# Patient Record
Sex: Male | Born: 2000 | Race: White | Hispanic: No | Marital: Single | Smoking: Never smoker
Health system: Southern US, Community
[De-identification: ages and names within clinical notes are randomized; demographics above are authoritative.]

## PROBLEM LIST (undated history)

## (undated) DIAGNOSIS — R51 Headache: Secondary | ICD-10-CM

## (undated) DIAGNOSIS — R519 Headache, unspecified: Secondary | ICD-10-CM

## (undated) HISTORY — DX: Headache, unspecified: R51.9

## (undated) HISTORY — DX: Headache: R51

---
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
## 2019-01-07 DIAGNOSIS — M5134 Other intervertebral disc degeneration, thoracic region: Secondary | ICD-10-CM | POA: Diagnosis not present

## 2019-01-07 DIAGNOSIS — R200 Anesthesia of skin: Secondary | ICD-10-CM | POA: Diagnosis not present

## 2019-01-09 ENCOUNTER — Other Ambulatory Visit: Payer: Self-pay | Admitting: Neurology

## 2019-01-09 ENCOUNTER — Other Ambulatory Visit: Payer: Self-pay | Admitting: Internal Medicine

## 2019-01-09 ENCOUNTER — Other Ambulatory Visit: Payer: 59

## 2019-01-09 DIAGNOSIS — Z20822 Contact with and (suspected) exposure to covid-19: Secondary | ICD-10-CM

## 2019-01-09 DIAGNOSIS — R6889 Other general symptoms and signs: Secondary | ICD-10-CM | POA: Diagnosis not present

## 2019-01-09 DIAGNOSIS — R200 Anesthesia of skin: Secondary | ICD-10-CM

## 2019-01-11 ENCOUNTER — Other Ambulatory Visit: Payer: Self-pay

## 2019-01-11 ENCOUNTER — Ambulatory Visit (HOSPITAL_COMMUNITY)
Admission: RE | Admit: 2019-01-11 | Discharge: 2019-01-11 | Disposition: A | Discharge status: Home / Self Care | Payer: 59 | Source: Ambulatory Visit | Attending: Neurology | Admitting: Neurology

## 2019-01-11 DIAGNOSIS — R200 Anesthesia of skin: Secondary | ICD-10-CM | POA: Diagnosis not present

## 2019-01-11 MED ORDER — GADOBUTROL 1 MMOL/ML IV SOLN
7.0000 mL | Freq: Once | INTRAVENOUS | Status: AC | PRN
  Administered 2019-01-11: 7 mL via INTRAVENOUS

## 2019-01-13 LAB — NOVEL CORONAVIRUS, NAA: SARS-CoV-2, NAA: NOT DETECTED

## 2019-05-15 ENCOUNTER — Other Ambulatory Visit (INDEPENDENT_AMBULATORY_CARE_PROVIDER_SITE_OTHER): Payer: 59 | Admitting: *Deleted

## 2019-05-15 ENCOUNTER — Other Ambulatory Visit: Payer: Self-pay

## 2019-05-15 DIAGNOSIS — Z23 Encounter for immunization: Secondary | ICD-10-CM | POA: Diagnosis not present

## 2019-05-28 ENCOUNTER — Ambulatory Visit (INDEPENDENT_AMBULATORY_CARE_PROVIDER_SITE_OTHER): Payer: 59 | Admitting: Family Medicine

## 2019-05-28 ENCOUNTER — Other Ambulatory Visit: Payer: Self-pay

## 2019-05-28 ENCOUNTER — Encounter: Payer: Self-pay | Admitting: Family Medicine

## 2019-05-28 DIAGNOSIS — R05 Cough: Secondary | ICD-10-CM | POA: Diagnosis not present

## 2019-05-28 DIAGNOSIS — J301 Allergic rhinitis due to pollen: Secondary | ICD-10-CM | POA: Diagnosis not present

## 2019-05-28 DIAGNOSIS — R059 Cough, unspecified: Secondary | ICD-10-CM

## 2019-05-28 MED ORDER — FLUTICASONE PROPIONATE 50 MCG/ACT NA SUSP: 2.0000 | Freq: Every day | NASAL | 5 refills | Status: DC

## 2019-05-28 NOTE — Progress Notes (Addendum)
   Subjective:    Patient ID: Devin Palmer, male    DOB: December 26, 2000, 18 y.o.   MRN: [id number] Virtual Visit via Video Note  I connected with Devin Palmer on 06/16/19 at  4:10 PM EST by a video enabled telemedicine application and verified that I am speaking with the correct person using two identifiers.  Location: Patient: home Provider: office   I discussed the limitations of evaluation and management by telemedicine and the availability of in person appointments. The patient expressed understanding and agreed to proceed.  History of Present Illness:    Observations/Objective:   Assessment and Plan:   Follow Up Instructions:    I discussed the assessment and treatment plan with the patient. The patient was provided an opportunity to ask questions and all were answered. The patient agreed with the plan and demonstrated an understanding of the instructions.   The patient was advised to call back or seek an in-person evaluation if the symptoms worsen or if the condition fails to improve as anticipated.  I provided 17 minutes of non-face-to-face time during this encounter.   Devin Lange, MD   Cough This is a new problem. Episode onset: 2 weeks. The cough is productive of sputum (clear mucus). Associated symptoms include rhinorrhea. Pertinent negatives include no chest pain, chills, ear pain, fever or wheezing. Treatments tried: allergy meds. The treatment provided moderate relief.   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  HENT: Positive for congestion and rhinorrhea. Negative for ear pain.   Eyes: Negative for discharge.  Respiratory: Positive for cough. Negative for wheezing.   Cardiovascular: Negative for chest pain.  Gastrointestinal: Negative for nausea and vomiting.  Musculoskeletal: Negative for arthralgias.  Patient denies sinus tenderness     Objective:    Patient had virtual visit Appears to be in no distress Atraumatic Neuro able to relate and oriented No apparent resp distress Color normal      Assessment & Plan:  I believe the patient probably had a viral illness last week or environmental head congestion I believe he started to get better but the evening cough more likely postnasal drip I did offer to listen to his lungs at his convenience in the future In addition to this also recommend that the patient follow-up with [place] if any ongoing troubles Flonase and Claritin on a regular basis hopefully this will help lessen the patient to give [place] an update within the next 1 to 2 weeks

## 2019-06-14 ENCOUNTER — Ambulatory Visit (INDEPENDENT_AMBULATORY_CARE_PROVIDER_SITE_OTHER): Payer: 59 | Admitting: Family Medicine

## 2019-06-14 ENCOUNTER — Other Ambulatory Visit: Payer: Self-pay

## 2019-06-14 ENCOUNTER — Encounter: Payer: Self-pay | Admitting: Family Medicine

## 2019-06-14 DIAGNOSIS — R21 Rash and other nonspecific skin eruption: Secondary | ICD-10-CM

## 2019-06-14 MED ORDER — DOXYCYCLINE HYCLATE 100 MG PO TABS: 100.0000 mg | ORAL_TABLET | Freq: Two times a day (BID) | ORAL | 0 refills | Status: DC

## 2019-06-14 NOTE — Progress Notes (Signed)
   Subjective:  Audio plus video  Patient ID: Devin Palmer, male    DOB: Nov 20, 2000, 18 y.o.   MRN: [id number]  HPI  Patient stating that he popped a bump on his lip last night and this morning the bump is swollen and lip is swollen.  Virtual Visit via Video Note  I connected with Devin Palmer on 06/14/19 at 10:00 AM EST by a video enabled telemedicine application and verified that I am speaking with the correct person using two identifiers.  Location: Patient: home Provider: office   I discussed the limitations of evaluation and management by telemedicine and the availability of in person appointments. The patient expressed understanding and agreed to proceed.  History of Present Illness:    Observations/Objective:   Assessment and Plan:   Follow Up Instructions:    I discussed the assessment and treatment plan with the patient. The patient was provided an opportunity to ask questions and all were answered. The patient agreed with the plan and demonstrated an understanding of the instructions.   The patient was advised to call back or seek an in-person evaluation if the symptoms worsen or if the condition fails to improve as anticipated.  I provided 18 minutes of non-face-to-face time during this encounter.   Patient had a pimple-like bump on his lower lip.  No history of fever blisters.  He messed with it and squeeze on it.  Today he has developed substantial more swelling of his lower lip.  Somewhat tender to palpation.  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  Warning signs discussed.  Antibiotics prescribed Doxy 100 twice daily 10 days local measures discussed

## 2019-11-06 ENCOUNTER — Telehealth: Payer: Self-pay | Admitting: Family Medicine

## 2019-11-06 NOTE — Telephone Encounter (Signed)
Contacted mom. Mom verbalized understanding. Mom states she will get Dylan to call back and schedule appt.

## 2019-11-06 NOTE — Telephone Encounter (Signed)
Needs ov for documentation purposes

## 2019-11-06 NOTE — Telephone Encounter (Signed)
Pt mom sent MyChart message:   Hey Dr. Nicki Reaper. Camillia Herter has been having light sensitivity.  Sometimes causes him headaches but not always Dominica Severin has this bad too). Was wandering if we could drop off a form from [facility] for tinted windows?   We already had a letter from Dr. Marin Comment (our neighbor) but would rather have one from you.  Or if he has to see you.   Thanks.  Please advise. Thank you

## 2019-11-18 ENCOUNTER — Other Ambulatory Visit: Payer: Self-pay

## 2019-11-18 ENCOUNTER — Telehealth: Payer: Self-pay | Admitting: *Deleted

## 2019-11-18 ENCOUNTER — Telehealth (INDEPENDENT_AMBULATORY_CARE_PROVIDER_SITE_OTHER): Payer: 59 | Admitting: Family Medicine

## 2019-11-18 DIAGNOSIS — R519 Headache, unspecified: Secondary | ICD-10-CM

## 2019-11-18 DIAGNOSIS — L568 Other specified acute skin changes due to ultraviolet radiation: Secondary | ICD-10-CM

## 2019-11-18 NOTE — Progress Notes (Signed)
   Subjective:    Patient ID: Devin Palmer, male    DOB: 09-Aug-2000, 19 y.o.   MRN: [id number]  Headache  This is a recurrent problem. Episode onset: summertime when its bright outside. Pain location: forehead and behind eyes. Quality: pressure. Treatments tried: sunglasses when outside.  Patient gets headaches when exposed to bright lights and sunlight he has to use dark sunglasses to lessen the problem when he drives some coming in through the side causes frequent headaches for him Virtual Visit via Video Note  I connected with Devin Palmer on 11/18/19 at  2:00 PM EDT by a video enabled telemedicine application and verified that I am speaking with the correct person using two identifiers.  Location: Patient: home Provider: [place]   I discussed the limitations of evaluation and management by telemedicine and the availability of in person appointments. The patient expressed understanding and agreed to proceed.  History of Present Illness:    Observations/Objective:   Assessment and Plan:   Follow Up Instructions:    I discussed the assessment and treatment plan with the patient. The patient was provided an opportunity to ask questions and all were answered. The patient agreed with the plan and demonstrated an understanding of the instructions.   The patient was advised to call back or seek an in-person evaluation if the symptoms worsen or if the condition fails to improve as anticipated.  I provided 20- minutes of non-face-to-face time during this encounter.      Review of Systems  Neurological: Positive for headaches.  Denies double vision nausea vomiting     Objective:   Physical Exam  Patient had virtual visit Appears to be in no distress Atraumatic Neuro able to relate and oriented No apparent resp distress Color normal       Assessment & Plan:  Frequent headaches related to bright light May use dark sunglasses Waiver for him to get tinted windows  was signed Patient was cautioned that dark windows could increase the risk of accidents and that is very important for him to always be certain no cars are coming in his area

## 2019-11-18 NOTE — Telephone Encounter (Signed)
Devin Palmer, Devin Palmer are scheduled for a virtual visit with your provider today.    Just as we do with appointments in the office, we must obtain your consent to participate.  Your consent will be active for this visit and any virtual visit you may have with one of our providers in the next 365 days.    If you have a MyChart account, I can also send a copy of this consent to you electronically.  All virtual visits are billed to your insurance company just like a traditional visit in the office.  As this is a virtual visit, video technology does not allow for your provider to perform a traditional examination.  This may limit your provider's ability to fully assess your condition.  If your provider identifies any concerns that need to be evaluated in person or the need to arrange testing such as labs, EKG, etc, we will make arrangements to do so.    Although advances in technology are sophisticated, we cannot ensure that it will always work on either your end or our end.  If the connection with a video visit is poor, we may have to switch to a telephone visit.  With either a video or telephone visit, we are not always able to ensure that we have a secure connection.   I need to obtain your verbal consent now.   Are you willing to proceed with your visit today?   Devin Palmer has provided verbal consent on 11/18/2019 for a virtual visit (video or telephone).   Dayton Bailiff, LPN [id number]  [id number] PM

## 2019-12-06 DIAGNOSIS — Z20822 Contact with and (suspected) exposure to covid-19: Secondary | ICD-10-CM | POA: Diagnosis not present

## 2019-12-06 DIAGNOSIS — Z03818 Encounter for observation for suspected exposure to other biological agents ruled out: Secondary | ICD-10-CM | POA: Diagnosis not present

## 2019-12-06 DIAGNOSIS — Z20828 Contact with and (suspected) exposure to other viral communicable diseases: Secondary | ICD-10-CM | POA: Diagnosis not present

## 2020-04-23 ENCOUNTER — Other Ambulatory Visit: Payer: Self-pay

## 2020-04-23 ENCOUNTER — Ambulatory Visit (INDEPENDENT_AMBULATORY_CARE_PROVIDER_SITE_OTHER): Payer: 59 | Admitting: Family Medicine

## 2020-04-23 ENCOUNTER — Encounter: Payer: Self-pay | Admitting: Family Medicine

## 2020-04-23 VITALS — HR 72 | Temp 99.8°F | Resp 16

## 2020-04-23 DIAGNOSIS — R509 Fever, unspecified: Secondary | ICD-10-CM

## 2020-04-23 DIAGNOSIS — B349 Viral infection, unspecified: Secondary | ICD-10-CM | POA: Diagnosis not present

## 2020-04-23 LAB — POCT RAPID STREP A (OFFICE): Rapid Strep A Screen: NEGATIVE

## 2020-04-23 NOTE — Progress Notes (Signed)
   Patient ID: Devin Palmer, male    DOB: [birth year], 19 y.o.   MRN: [id number]   Chief Complaint  Patient presents with  . Sore Throat   Subjective:  CC: low-grade fever, scratchy throat, and vomited x 1  Sore Throat  This is a new problem. Episode onset: tuesday. Maximum temperature: low grade. Associated symptoms include congestion and vomiting. Pertinent negatives include no headaches. He has tried acetaminophen for the symptoms.  Patient states he has not had any symptoms since Tuesday other than some congestion and low grade fever. Denies headache and no stomach pain now.    Medical History Devin Palmer has a past medical history of Generalized headaches.   No outpatient encounter medications on file as of 04/23/2020.   No facility-administered encounter medications on file as of 04/23/2020.     Review of Systems  Constitutional: Positive for fever.  HENT: Positive for congestion and sore throat.        Described as scratchy. Okay now.  Gastrointestinal: Positive for vomiting.  Neurological: Negative for headaches.     Vitals Pulse 72   Temp 99.8 F (37.7 C)   Resp 16   SpO2 96%   Objective:   Physical Exam Vitals and nursing note reviewed.  Constitutional:      Appearance: He is not ill-appearing or toxic-appearing.  HENT:     Right Ear: Tympanic membrane normal.     Left Ear: Tympanic membrane normal.     Mouth/Throat:     Pharynx: Posterior oropharyngeal erythema present. No oropharyngeal exudate or uvula swelling.     Tonsils: No tonsillar exudate.  Cardiovascular:     Rate and Rhythm: Normal rate and regular rhythm.     Heart sounds: Normal heart sounds.  Pulmonary:     Effort: Pulmonary effort is normal.     Breath sounds: Normal breath sounds.  Lymphadenopathy:     Cervical:     Right cervical: No superficial, deep or posterior cervical adenopathy.    Left cervical: No superficial, deep or posterior cervical adenopathy.  Skin:    General: Skin  is warm and dry.  Neurological:     Mental Status: He is alert and oriented to person, place, and time.      Assessment and Plan   1. Fever, unspecified fever cause - Novel Coronavirus, NAA ([facility]) - Culture, Group A Strep - POCT rapid strep A  2. Viral syndrome   Reports not feeling too bad. Has cough and low grade fever. T-max 100.3 at home. Has history of asymptomatic strep infection: rapid strep negative today, will culture. No adenopathy or pharyngeal exudate today.  Covid test performed: will notify with results.   This is likely a virus and will run its course. Will notify you when Covid results are available. Will treat symptoms with OTC medications as needed and lots of rest and hydration. Tylenol has been effective for fever.   Agrees with plan of care discussed today. Understands warning signs to seek further care: fever, shortness of breath.  Understands to follow-up if he is not improving or his symptoms worsen.

## 2020-04-25 LAB — SPECIMEN STATUS REPORT

## 2020-04-25 LAB — SARS-COV-2, NAA 2 DAY TAT

## 2020-04-25 LAB — NOVEL CORONAVIRUS, NAA: SARS-CoV-2, NAA: NOT DETECTED

## 2020-04-27 LAB — SPECIMEN STATUS REPORT

## 2020-04-27 LAB — CULTURE, GROUP A STREP

## 2020-04-29 ENCOUNTER — Other Ambulatory Visit: Payer: Self-pay | Admitting: Family Medicine

## 2020-04-29 DIAGNOSIS — B955 Unspecified streptococcus as the cause of diseases classified elsewhere: Secondary | ICD-10-CM | POA: Insufficient documentation

## 2020-04-29 MED ORDER — PENICILLIN V POTASSIUM 500 MG PO TABS: 500.0000 mg | ORAL_TABLET | Freq: Three times a day (TID) | ORAL | 0 refills | Status: AC

## 2020-08-13 ENCOUNTER — Telehealth (INDEPENDENT_AMBULATORY_CARE_PROVIDER_SITE_OTHER): Payer: Self-pay | Admitting: Internal Medicine

## 2020-08-13 NOTE — Telephone Encounter (Signed)
Spoke with mother Devin Melroy RN at [facility] who has an appointment on Tuesday 2/1 - she would like for patient to take her appointment on Tuesday - he has been having loose stools and acid reflux - please advise if you need a referral or not

## 2020-08-14 ENCOUNTER — Ambulatory Visit: Payer: 59 | Admitting: Family Medicine

## 2020-08-14 NOTE — Telephone Encounter (Signed)
Yes we can see Devin Palmer instead of Christy on Tuesday

## 2020-08-19 ENCOUNTER — Encounter: Payer: Self-pay | Admitting: Family Medicine

## 2020-08-19 ENCOUNTER — Ambulatory Visit (INDEPENDENT_AMBULATORY_CARE_PROVIDER_SITE_OTHER): Payer: 59 | Admitting: Family Medicine

## 2020-08-19 ENCOUNTER — Other Ambulatory Visit: Payer: Self-pay

## 2020-08-19 VITALS — BP 112/72 | HR 109 | Temp 98.9°F | Ht 74.0 in | Wt 182.4 lb

## 2020-08-19 DIAGNOSIS — R197 Diarrhea, unspecified: Secondary | ICD-10-CM

## 2020-08-19 DIAGNOSIS — K219 Gastro-esophageal reflux disease without esophagitis: Secondary | ICD-10-CM | POA: Insufficient documentation

## 2020-08-19 DIAGNOSIS — Z91013 Allergy to seafood: Secondary | ICD-10-CM | POA: Diagnosis not present

## 2020-08-19 MED ORDER — VALACYCLOVIR HCL 1 G PO TABS: ORAL_TABLET | ORAL | 5 refills | Status: DC

## 2020-08-19 NOTE — Progress Notes (Signed)
Referral placed.

## 2020-08-19 NOTE — Addendum Note (Signed)
Addended by: Vicente Males on: 08/19/2020 04:22 PM   Modules accepted: Orders

## 2020-08-19 NOTE — Patient Instructions (Addendum)
Food Choices for Gastroesophageal Reflux Disease, Adult When you have gastroesophageal reflux disease (GERD), the foods you eat and your eating habits are very important. Choosing the right foods can help ease your discomfort. Think about working with a food expert (dietitian) to help you make good choices. What are tips for following this plan? Reading food labels  Look for foods that are low in saturated fat. Foods that may help with your symptoms include: ? Foods that have less than 5% of daily value (DV) of fat. ? Foods that have 0 grams of trans fat. Cooking  Do not fry your food.  Cook your food by baking, steaming, grilling, or broiling. These are all methods that do not need a lot of fat for cooking.  To add flavor, try to use herbs that are low in spice and acidity. Meal planning  Choose healthy foods that are low in fat, such as: ? Fruits and vegetables. ? Whole grains. ? Low-fat dairy products. ? Lean meats, fish, and poultry.  Eat small meals often instead of eating 3 large meals each day. Eat your meals slowly in a place where you are relaxed. Avoid bending over or lying down until 2-3 hours after eating.  Limit high-fat foods such as fatty meats or fried foods.  Limit your intake of fatty foods, such as oils, butter, and shortening.  Avoid the following as told by your doctor: ? Foods that cause symptoms. These may be different for different people. Keep a food diary to keep track of foods that cause symptoms. ? Alcohol. ? Drinking a lot of liquid with meals. ? Eating meals during the 2-3 hours before bed.   Lifestyle  Stay at a healthy weight. Ask your doctor what weight is healthy for you. If you need to lose weight, work with your doctor to do so safely.  Exercise for at least 30 minutes on 5 or more days each week, or as told by your doctor.  Wear loose-fitting clothes.  Do not smoke or use any products that contain nicotine or tobacco. If you need help  quitting, ask your doctor.  Sleep with the head of your bed higher than your feet. Use a wedge under the mattress or blocks under the bed frame to raise the head of the bed.  Chew sugar-free gum after meals. What foods should eat? Eat a healthy, well-balanced diet of fruits, vegetables, whole grains, low-fat dairy products, lean meats, fish, and poultry. Each person is different. Foods that may cause symptoms in one person may not cause any symptoms in another person. Work with your doctor to find foods that are safe for you. The items listed above may not be a complete list of what you can eat and drink. Contact a food expert for more options.   What foods should I avoid? Limiting some of these foods may help in managing the symptoms of GERD. Everyone is different. Talk with a food expert or your doctor to help you find the exact foods to avoid, if any. Fruits Any fruits prepared with added fat. Any fruits that cause symptoms. For some people, this may include citrus fruits, such as oranges, grapefruit, pineapple, and lemons. Vegetables Deep-fried vegetables. French fries. Any vegetables prepared with added fat. Any vegetables that cause symptoms. For some people, this may include tomatoes and tomato products, chili peppers, onions and garlic, and horseradish. Grains Pastries or quick breads with added fat. Meats and other proteins High-fat meats, such as fatty beef or pork,   hot dogs, ribs, ham, sausage, salami, and bacon. Fried meat or protein, including fried fish and fried chicken. Nuts and nut butters, in large amounts. Dairy Whole milk and chocolate milk. Sour cream. Cream. Ice cream. Cream cheese. Milkshakes. Fats and oils Butter. Margarine. Shortening. Ghee. Beverages Coffee and tea, with or without caffeine. Carbonated beverages. Sodas. Energy drinks. Fruit juice made with acidic fruits, such as orange or grapefruit. Tomato juice. Alcoholic drinks. Sweets and desserts Chocolate and  cocoa. Donuts. Seasonings and condiments Pepper. Peppermint and spearmint. Added salt. Any condiments, herbs, or seasonings that cause symptoms. For some people, this may include curry, hot sauce, or vinegar-based salad dressings. The items listed above may not be a complete list of what you should not eat and drink. Contact a food expert for more options. Questions to ask your doctor Diet and lifestyle changes are often the first steps that are taken to manage symptoms of GERD. If diet and lifestyle changes do not help, talk with your doctor about taking medicines. Where to find more information  International Foundation for Gastrointestinal Disorders: aboutgerd.org Summary  When you have GERD, food and lifestyle choices are very important in easing your symptoms.  Eat small meals often instead of 3 large meals a day. Eat your meals slowly and in a place where you are relaxed.  Avoid bending over or lying down until 2-3 hours after eating.  Limit high-fat foods such as fatty meats or fried foods. This information is not intended to replace advice given to you by your health care provider. Make sure you discuss any questions you have with your health care provider. Document Revised: 01/13/2020 Document Reviewed: 01/13/2020 Elsevier Patient Education  2021 Elsevier Inc.  

## 2020-08-19 NOTE — Progress Notes (Signed)
   Subjective:    Patient ID: Devin Palmer, male    DOB: 2001-02-03, 20 y.o.   MRN: [id number]  HPI Pt has been having abdominal pain for a few months. Pt has spoke with provider about a possible shellfish allergy. Pt states his pain has recently improved but before improving, the pain worsened. Pt states the pain is worse when needing to use restroom. Pt states that when he eats shellfish, he has an immediate reaction and begins to have abdominal cramps and diarrhea. Shellfish does not bother him if it is cooked with something.    occas reflux feels like he gets this in the back of his throat occurs twice a week we have discussed the importance of a healthy diet minimizing tomato based products chocolates caffeine's  He denies heartburn with it denies dysphagia denies any vomiting has tried OTC Tums  He also relates that he has an area that has appeared on the right lower lip that has intermittently occurred where it seemed like a pimple and then it swells up really big and a lot of times gets better with Benadryl or just gets better on its own over 2 to 3 days no pus from it no drainage not severely tender   Review of Systems    Please see above Objective:   Physical Exam Neck no masses no adenopathy lungs clear no crackles heart was regular abdomen is soft no masses no tenderness HEENT he has a couple what looks like temple areas near the right outer corner of his mouth  He shows me a video that depicts a swollen lip around the area of this when it does happen it seems to happen every several months       Assessment & Plan:  It is possible this could be herpes simplex recommend Valtrex 1000 mg take 2 now then repeat in 12 hours when these occur  I doubt that the lip issue is allergy I doubt it is a bacterial infection because it gets better so quickly quickly  Possible shellfish allergy causing the abdominal cramps and diarrhea offered blood testing as well as allergist testing  patient defers he states he will stay away from shellfish.  Less likely to be IBS because it only occurs with shellfish  Mild reflux related issues recommend trying Maalox or Mylanta and doing a reflux related diet because of the infrequency of his symptoms I do not recommend a PPI at this point  Later in the visit patient relates that his mother texted him that she would like for him to be seen by Dr.Rehmans group we will be happy to help set up the referral

## 2020-09-01 ENCOUNTER — Encounter (INDEPENDENT_AMBULATORY_CARE_PROVIDER_SITE_OTHER): Payer: Self-pay | Admitting: Internal Medicine

## 2020-09-01 ENCOUNTER — Other Ambulatory Visit: Payer: Self-pay

## 2020-09-01 ENCOUNTER — Ambulatory Visit (INDEPENDENT_AMBULATORY_CARE_PROVIDER_SITE_OTHER): Payer: 59 | Admitting: Internal Medicine

## 2020-09-01 VITALS — BP 123/70 | HR 69 | Temp 98.9°F | Ht 75.0 in | Wt 182.0 lb

## 2020-09-01 DIAGNOSIS — K219 Gastro-esophageal reflux disease without esophagitis: Secondary | ICD-10-CM

## 2020-09-01 DIAGNOSIS — K580 Irritable bowel syndrome with diarrhea: Secondary | ICD-10-CM

## 2020-09-01 DIAGNOSIS — K589 Irritable bowel syndrome without diarrhea: Secondary | ICD-10-CM | POA: Insufficient documentation

## 2020-09-01 MED ORDER — DICYCLOMINE HCL 20 MG PO TABS: 10.0000 mg | ORAL_TABLET | Freq: Two times a day (BID) | ORAL | 5 refills | Status: AC

## 2020-09-01 MED ORDER — FAMOTIDINE 40 MG PO TABS
40.0000 mg | ORAL_TABLET | Freq: Every day | ORAL | 2 refills | Status: DC | PRN
Start: 2020-09-01 — End: 2021-03-09

## 2020-09-01 NOTE — Patient Instructions (Signed)
Use Pepcid/famotidine on as-needed basis. Take dicyclomine 10 mg daily 30 minutes before breakfast daily.  If you do not see any improvement then increase it to twice daily after 2 weeks Symptom diary until office visit in 2 months.

## 2020-09-01 NOTE — Progress Notes (Signed)
Presenting complaint;  Heartburn lower abdominal pain and diarrhea.  History of present illness  Patient is 20 year old Caucasian male who is referred through courtesy of Dr. Sallee Lange for GI evaluation. Patient says he has been experiencing lower abdominal cramping off and on for about 2 years.  Lately the spells been occurring less frequently.  Each spell is associated with nausea hypersalivation followed by loose stool with relief of his cramping and nausea.  These spells mostly occur in the morning but 4 days ago he had a spell in the evening.  He usually has 1-2 formed stools daily.  He denies melena or rectal bleeding.  He has not lost any weight.  He states his appetite is very good.  He says he eats a lot but he is not gaining any weight.  He says he plays basketball about 3 hours every day. He also complains of feeling of lump in his throat which occurs a couple of times a week.  He does not describe this to be heartburn.  He states the symptom usually is triggered by chocolate pizza or tomato-based foods. He states he does not eat breakfast.  He generally has a cup of coffee around 9 AM.  He has lunch around noon and supper between 5 and 8 PM. Family history is negative for inflammatory bowel disease or celiac disease. Patient is presently not taking any medications. He was seen by Dr. Sallee Lange on 08/19/2020.  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   Current Medications: Outpatient Encounter Medications as of 09/01/2020  Medication Sig  . valACYclovir (VALTREX) 1000 MG tablet 2 now and repeat 2 pills in 12 hours (Patient not taking: Reported on 09/01/2020)   No facility-administered encounter medications on file as of 09/01/2020.   Past medical history  He suffered fractured right radius and February 2013 treated with immobilization.  He did not require any surgery. He received hepatitis A and vaccination while he was in middle school. He  opted not to be vaccinated for Covid.  Allergies  Allergies  Allergen Reactions  . Red Dye Nausea Only    diarrhea  . Shellfish Allergy     Diarrhea with abdominal cramps   Family history  Father is 23 years old in good health. Mother has GERD and IBS.  She is 21 years old. He has a brother age 55 years in good health  Social history  He is single.  He graduated from high school and 2019. 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 He does not smoke cigarettes.  He drinks alcohol occasionally. He states he stays busy he plays basketball for 3 hours just about every day.  Physical examination  Blood pressure 123/70, pulse 69, temperature 98.9 F (37.2 C), temperature source Oral, height 6\' 3"  (1.905 m), weight 182 lb (82.6 kg). Patient is alert and in no acute distress. He is wearing a mask. Conjunctiva is pink. Sclera is nonicteric Oropharyngeal mucosa is normal. No neck masses or thyromegaly noted. Cardiac exam with regular rhythm normal S1 and S2. No murmur or gallop noted. Lungs are clear to auscultation. Abdomen is flat.  Bowel sounds are normal.  On palpation abdomen is soft and nontender with organomegaly or masses. No LE edema or clubbing noted.  Labs/studies Results:  From 11/26/2018 WBC 5.6, H&H 16.1 and 46.3 and platelet count 309K. Glucose 87, BUN 12, creatinine 0.81 Serum sodium 142, potassium 4.7, chloride 104, CO2 25 and  serum calcium 9.4 Bilirubin 0.  4, AP 80, AST 26, ALT 27, total protein 6.9 and albumin 4.6.  Assessment:  #1.  Lower abdominal pain.  He is having episodic pain associated with urgency and diarrhea resulting in relief of abdominal pain.  He does not have any alarm symptoms such as weight loss melena or rectal bleeding.  He has had these spells off and on for 2 years and frequency has decreased.  I suspect we are dealing with irritable bowel syndrome.  If he does not respond to therapy will consider further evaluation.  #2.   GERD.  Atypical symptoms and feeling of lump in suprasternal region/throat always triggered by tomato based foods, chocolate and pizza.  We will treat him with as needed famotidine and see how he does.   Plan:  Dicyclomine 10 mg before breakfast or lunch and evening meal.  He can first try 1 pill for few days and see if he needs to take second dose. Famotidine OTC 40 mg to be taken prior to when he is going to be eating trigger foods. Patient advised to keep symptom diary as to frequency of abdominal pain and diarrhea until office visit in 2 months.

## 2020-09-24 ENCOUNTER — Telehealth: Payer: Self-pay

## 2020-09-24 NOTE — Telephone Encounter (Signed)
Need Copy of all shot records   Call back [phone]

## 2020-09-24 NOTE — Telephone Encounter (Signed)
Vaccine record ready for pickup. Pt notified.

## 2020-10-26 ENCOUNTER — Other Ambulatory Visit: Payer: Self-pay

## 2020-10-26 ENCOUNTER — Emergency Department (HOSPITAL_COMMUNITY)
Admission: EM | Admit: 2020-10-26 | Discharge: 2020-10-26 | Disposition: A | Discharge status: Home / Self Care | Payer: 59 | Attending: Emergency Medicine | Admitting: Emergency Medicine

## 2020-10-26 ENCOUNTER — Encounter (HOSPITAL_COMMUNITY): Payer: Self-pay

## 2020-10-26 DIAGNOSIS — Z87891 Personal history of nicotine dependence: Secondary | ICD-10-CM | POA: Diagnosis not present

## 2020-10-26 DIAGNOSIS — R55 Syncope and collapse: Secondary | ICD-10-CM | POA: Insufficient documentation

## 2020-10-26 DIAGNOSIS — R251 Tremor, unspecified: Secondary | ICD-10-CM | POA: Diagnosis present

## 2020-10-26 DIAGNOSIS — R531 Weakness: Secondary | ICD-10-CM | POA: Diagnosis not present

## 2020-10-26 DIAGNOSIS — R42 Dizziness and giddiness: Secondary | ICD-10-CM | POA: Insufficient documentation

## 2020-10-26 DIAGNOSIS — R202 Paresthesia of skin: Secondary | ICD-10-CM | POA: Diagnosis not present

## 2020-10-26 LAB — URINALYSIS, ROUTINE W REFLEX MICROSCOPIC
Bilirubin Urine: NEGATIVE
Glucose, UA: NEGATIVE mg/dL
Hgb urine dipstick: NEGATIVE
Ketones, ur: NEGATIVE mg/dL
Leukocytes,Ua: NEGATIVE
Nitrite: NEGATIVE
Protein, ur: NEGATIVE mg/dL
Specific Gravity, Urine: 1.002 — ABNORMAL LOW (ref 1.005–1.030)
pH: 7 (ref 5.0–8.0)

## 2020-10-26 LAB — CBC WITH DIFFERENTIAL/PLATELET
Abs Immature Granulocytes: 0.03 10*3/uL (ref 0.00–0.07)
Basophils Absolute: 0.1 10*3/uL (ref 0.0–0.1)
Basophils Relative: 1 %
Eosinophils Absolute: 0.1 10*3/uL (ref 0.0–0.5)
Eosinophils Relative: 2 %
HCT: 48.4 % (ref 39.0–52.0)
Hemoglobin: 16.3 g/dL (ref 13.0–17.0)
Immature Granulocytes: 1 %
Lymphocytes Relative: 23 %
Lymphs Abs: 1.4 10*3/uL (ref 0.7–4.0)
MCH: 31.1 pg (ref 26.0–34.0)
MCHC: 33.7 g/dL (ref 30.0–36.0)
MCV: 92.4 fL (ref 80.0–100.0)
Monocytes Absolute: 0.5 10*3/uL (ref 0.1–1.0)
Monocytes Relative: 9 %
Neutro Abs: 4 10*3/uL (ref 1.7–7.7)
Neutrophils Relative %: 64 %
Platelets: 293 10*3/uL (ref 150–400)
RBC: 5.24 MIL/uL (ref 4.22–5.81)
RDW: 13 % (ref 11.5–15.5)
WBC: 6.2 10*3/uL (ref 4.0–10.5)
nRBC: 0 % (ref 0.0–0.2)

## 2020-10-26 LAB — BASIC METABOLIC PANEL
Anion gap: 12 (ref 5–15)
BUN: 10 mg/dL (ref 6–20)
CO2: 25 mmol/L (ref 22–32)
Calcium: 8.7 mg/dL — ABNORMAL LOW (ref 8.9–10.3)
Chloride: 102 mmol/L (ref 98–111)
Creatinine, Ser: 0.84 mg/dL (ref 0.61–1.24)
GFR, Estimated: >60 mL/min (ref >60)
Glucose, Bld: 87 mg/dL (ref 70–99)
Potassium: 3.7 mmol/L (ref 3.5–5.1)
Sodium: 139 mmol/L (ref 135–145)

## 2020-10-26 NOTE — ED Triage Notes (Signed)
Woke up with left arm shaking, says he was eating lunch and his who body began shaking.  Pt shaking, resolves and reoccurs, skin warm and dry.  No other symptoms other than shaking better when laying

## 2020-10-26 NOTE — Discharge Instructions (Signed)
You are seen in the ER for near fainting spell and shaking. Your blood work is normal.  The vital signs here are stable.  We are not sure what caused you to have the symptoms.  It could be a vasovagal event... For now, we do not think any further testing or even follow-up is needed.  If you start having repeat episodes of shaking, then you will need to see a primary care doctor for further assessment.

## 2020-10-26 NOTE — ED Provider Notes (Signed)
[facility] EMERGENCY DEPARTMENT Provider Note   CSN: [id number] Arrival date & time: 10/26/20  1340     History Chief Complaint  Patient presents with  . Weakness    Shaking    Devin Palmer is a 20 y.o. male.  HPI    20 year old male comes in a chief complaint of shaking. He has history of IBS, GERD.  Patient was at his lunch break, he had just gone to a fast food chain to grab food.  He was sitting in his car and finished his lunch.  He left the [facility] on.  He might have napped for a minute in his car, when he got up he noticed shaking in both of his hands.  When he tried to walk out of the car, the shaking persisted and he started feeling dizzy and felt like he was going to faint on 4 different occasions.  He describes the dizziness as feeling lightheaded and not spinning sensation.  He did not have any vision change, new focal numbness or weakness.  Patient denies any vomiting, diarrhea, reduced p.o. intake, substance abuse, chest pain, palpitations.  Nursing staff informs that when he first arrived he did appear pale to them.  Patient states that he did feel clammy.    Past Medical History:  Diagnosis Date  . Generalized headaches     Patient Active Problem List   Diagnosis Date Noted  . IBS (irritable bowel syndrome) 09/01/2020  . 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 Gastroesophageal reflux disease without esophagitis 08/19/2020  . Beta hemolytic Streptococcus culture positive 04/29/2020  . Viral syndrome 04/23/2020  . Fever 04/23/2020  . Acne vulgaris 01/23/2018  . Radius fracture 08/29/2011    History reviewed. No pertinent surgical history.     Family History  Problem Relation Age of Onset  . Heart disease Other   . Arthritis Other   . Cancer Other   . Asthma Other   . Diabetes Other     Social History   Tobacco Use  . Smoking status: Never Smoker  . Smokeless tobacco: Former Systems developer    Types: Secondary school teacher  . Vaping Use: Former  Substance Use Topics   . Alcohol use: Yes    Comment: occasionally  . Drug use: No    Home Medications Prior to Admission medications   Medication Sig Start Date End Date Taking? Authorizing Provider  dicyclomine (BENTYL) 20 MG tablet Take 0.5 tablets (10 mg total) by mouth 2 (two) times daily before a meal. 09/01/20  Yes Rehman, Mechele Dawley, MD  loratadine (CLARITIN) 10 MG tablet Take 10 mg by mouth daily.   Yes [provider]  famotidine (PEPCID) 40 MG tablet Take 1 tablet (40 mg total) by mouth daily as needed for heartburn or indigestion. Patient not taking: No sig reported 09/01/20   Rogene Houston, MD  isotretinoin (ACCUTANE) 10 MG capsule Take 10 mg by mouth 2 (two) times daily. Patient not taking: No sig reported    [provider]  valACYclovir (VALTREX) 1000 MG tablet 2 now and repeat 2 pills in 12 hours Patient not taking: No sig reported 08/19/20   Kathyrn Drown, MD    Allergies    Red dye and Shellfish allergy  Review of Systems   Review of Systems  Constitutional: Positive for activity change and diaphoresis.  Respiratory: Negative for shortness of breath.   Cardiovascular: Negative for chest pain and palpitations.  Gastrointestinal: Positive for nausea. Negative for vomiting.  Allergic/Immunologic: Negative for immunocompromised state.  Hematological: Does not bruise/bleed easily.  All other systems reviewed and are negative.   Physical Exam Updated Vital Signs BP 136/72 (BP Location: Right Arm)   Pulse 68   Temp 97.9 F (36.6 C) (Oral)   Resp 20   Ht 6\' 3"  (1.905 m)   Wt 83.9 kg   SpO2 100%   BMI 23.12 kg/m   Physical Exam Vitals and nursing note reviewed.  Constitutional:      Appearance: He is well-developed.  HENT:     Head: Atraumatic.  Eyes:     Extraocular Movements: Extraocular movements intact.     Pupils: Pupils are equal, round, and reactive to light.  Cardiovascular:     Rate and Rhythm: Normal rate.     Heart sounds: No murmur  heard.   Pulmonary:     Effort: Pulmonary effort is normal.  Musculoskeletal:     Cervical back: Neck supple.  Skin:    General: Skin is warm.  Neurological:     Mental Status: He is alert and oriented to person, place, and time.     Cranial Nerves: No cranial nerve deficit.     Sensory: No sensory deficit.     Motor: No weakness.     Coordination: Coordination normal.     ED Results / Procedures / Treatments   Labs (all labs ordered are listed, but only abnormal results are displayed) Labs Reviewed  URINALYSIS, ROUTINE W REFLEX MICROSCOPIC - Abnormal; Notable for the following components:      Result Value   Color, Urine STRAW (*)    Specific Gravity, Urine 1.002 (*)    All other components within normal limits  BASIC METABOLIC PANEL - Abnormal; Notable for the following components:   Calcium 8.7 (*)    All other components within normal limits  CBC WITH DIFFERENTIAL/PLATELET    EKG None  Radiology No results found.  Procedures Procedures   Medications Ordered in ED Medications - No data to display  ED Course  I have reviewed the triage vital signs and the nursing notes.  Pertinent labs & imaging results that were available during my care of the patient were reviewed by me and considered in my medical decision making (see chart for details).    MDM Rules/Calculators/A&P                          20 year old comes in a chief complaint of dizziness and shaking.  He had bilateral upper extremity shaking and dizziness with near fainting spell.  Currently he does not have any shaking, he does feel little woozy.  No focal neuro deficits on exam, and at stroke scale is 0, cerebellar exam did not reveal any dysmetria and I ambulated the patient, he did not have any ataxia.  He has some chronic lower extremity paresthesia in upper extremity paresthesia.  Social history, past medical history is reassuring.  Does not appear to be orthostatic hypotension because of  dehydration.  Orthostatic blood pressure along with labs ordered.  Patient had no cardiac prodrome, which is reassuring.    5:25 PM Patient's lab results are reassuring.  He has been reassessed.  He feels a lot better.  We walked again, he does not feel dizzy anymore.  Stable for discharge.  Advised return to the ER if he starts having near fainting or fainting spell and to see his PCP if he noticed some shaking spells.  Final  Clinical Impression(s) / ED Diagnoses Final diagnoses:  Vasovagal episode    Rx / DC Orders ED Discharge Orders    None       Varney Biles, MD 10/26/20 1725

## 2020-10-26 NOTE — ED Notes (Signed)
Dr with pt

## 2020-11-18 ENCOUNTER — Other Ambulatory Visit (HOSPITAL_COMMUNITY): Payer: Self-pay

## 2020-11-18 MED ORDER — FAMOTIDINE 40 MG PO TABS
40.0000 mg | ORAL_TABLET | Freq: Every day | ORAL | 1 refills | Status: DC | PRN
  Filled 2020-11-18 (×2): qty 30, 30d supply, fill #0
  Filled 2021-01-01: qty 30, 30d supply, fill #1

## 2020-11-18 MED ORDER — DICYCLOMINE HCL 20 MG PO TABS
10.0000 mg | ORAL_TABLET | Freq: Two times a day (BID) | ORAL | 4 refills | Status: DC
  Filled 2020-11-18 (×2): qty 60, 60d supply, fill #0
  Filled 2021-02-04: qty 60, 60d supply, fill #1
  Filled 2021-02-15: qty 30, 30d supply, fill #2

## 2020-12-01 ENCOUNTER — Other Ambulatory Visit: Payer: Self-pay

## 2020-12-01 ENCOUNTER — Ambulatory Visit (INDEPENDENT_AMBULATORY_CARE_PROVIDER_SITE_OTHER): Payer: 59 | Admitting: Internal Medicine

## 2020-12-01 ENCOUNTER — Encounter (INDEPENDENT_AMBULATORY_CARE_PROVIDER_SITE_OTHER): Payer: Self-pay

## 2020-12-01 ENCOUNTER — Encounter (INDEPENDENT_AMBULATORY_CARE_PROVIDER_SITE_OTHER): Payer: Self-pay | Admitting: Internal Medicine

## 2020-12-01 VITALS — BP 120/74 | HR 71 | Temp 98.1°F | Ht 75.0 in | Wt 183.0 lb

## 2020-12-01 DIAGNOSIS — Z5321 Procedure and treatment not carried out due to patient leaving prior to being seen by health care provider: Secondary | ICD-10-CM

## 2020-12-01 NOTE — Patient Instructions (Signed)
Left before being seen.

## 2020-12-29 ENCOUNTER — Other Ambulatory Visit (HOSPITAL_COMMUNITY): Payer: Self-pay

## 2021-01-01 ENCOUNTER — Other Ambulatory Visit (HOSPITAL_COMMUNITY): Payer: Self-pay

## 2021-01-30 ENCOUNTER — Other Ambulatory Visit (INDEPENDENT_AMBULATORY_CARE_PROVIDER_SITE_OTHER): Payer: Self-pay | Admitting: Internal Medicine

## 2021-01-30 ENCOUNTER — Other Ambulatory Visit (HOSPITAL_COMMUNITY): Payer: Self-pay

## 2021-02-01 ENCOUNTER — Other Ambulatory Visit (HOSPITAL_COMMUNITY): Payer: Self-pay

## 2021-02-01 MED FILL — Famotidine Tab 40 MG: ORAL | 30 days supply | Qty: 30 | Fill #0 | Status: AC

## 2021-02-03 ENCOUNTER — Other Ambulatory Visit (HOSPITAL_COMMUNITY): Payer: Self-pay

## 2021-02-04 ENCOUNTER — Other Ambulatory Visit (HOSPITAL_COMMUNITY): Payer: Self-pay

## 2021-02-15 ENCOUNTER — Other Ambulatory Visit (HOSPITAL_COMMUNITY): Payer: Self-pay

## 2021-03-08 ENCOUNTER — Encounter (INDEPENDENT_AMBULATORY_CARE_PROVIDER_SITE_OTHER): Payer: Self-pay

## 2021-03-08 ENCOUNTER — Other Ambulatory Visit (INDEPENDENT_AMBULATORY_CARE_PROVIDER_SITE_OTHER): Payer: Self-pay | Admitting: *Deleted

## 2021-03-08 NOTE — Telephone Encounter (Signed)
Pt states famotidine was working well until about 2-3 weeks ago. Feels a discomfortant in stomach and throat like something is stuck. No abd pain. No fever. States constantly burping and stomach growling. Seen 09/01/20 for gerd. Had follow up on 12/01/20 but left without being seen. No upcoming appt.   [facility] in [place] on [address]. Pt will be at work after [employer]. Can send mychart message if after then.

## 2021-03-09 ENCOUNTER — Other Ambulatory Visit (HOSPITAL_COMMUNITY): Payer: Self-pay

## 2021-03-09 ENCOUNTER — Other Ambulatory Visit (INDEPENDENT_AMBULATORY_CARE_PROVIDER_SITE_OTHER): Payer: Self-pay | Admitting: *Deleted

## 2021-03-09 MED ORDER — PANTOPRAZOLE SODIUM 40 MG PO TBEC: 40.0000 mg | DELAYED_RELEASE_TABLET | Freq: Every day | ORAL | 3 refills | Status: DC

## 2021-03-09 MED FILL — Famotidine Tab 40 MG: ORAL | 30 days supply | Qty: 30 | Fill #1 | Status: AC

## 2021-08-07 ENCOUNTER — Other Ambulatory Visit (INDEPENDENT_AMBULATORY_CARE_PROVIDER_SITE_OTHER): Payer: Self-pay | Admitting: Internal Medicine

## 2021-10-08 ENCOUNTER — Ambulatory Visit: Payer: 59 | Admitting: Nurse Practitioner

## 2021-10-08 ENCOUNTER — Encounter: Payer: Self-pay | Admitting: Nurse Practitioner

## 2021-10-08 ENCOUNTER — Other Ambulatory Visit: Payer: Self-pay

## 2021-10-08 VITALS — BP 138/79 | HR 71 | Temp 98.4°F | Ht 75.0 in | Wt 192.0 lb

## 2021-10-08 DIAGNOSIS — H73893 Other specified disorders of tympanic membrane, bilateral: Secondary | ICD-10-CM

## 2021-10-08 NOTE — Patient Instructions (Signed)
Nasacort AQ ?Claritin or Allegra ? ?

## 2021-10-08 NOTE — Progress Notes (Signed)
? ?  Subjective:  ? ? Patient ID: Devin Palmer, male    DOB: Aug 06, 2000, 21 y.o.   MRN: [id number] ? ?HPI ?Presents with complaints of muffled sounds and mild changes in his hearing in the left ear that started about 5 months ago.  First noticed it while [place] the beach and got salt water in his ear.  Felt like the water stayed in his ear.  No pain.  No drainage from the ear.  Occasional popping.  Slight ringing at times.  [place]   ? ?Review of Systems  ?HENT:  Positive for hearing loss. Negative for congestion, ear discharge, ear pain, postnasal drip, rhinorrhea, sinus pressure, sinus pain, sneezing and sore throat.   ?Respiratory:  Negative for cough and wheezing.   ? ?   ?Objective:  ? Physical Exam ?NAD.  Alert, oriented.  TMs very retracted bilaterally, more on the left.  Pelvic normal limit.  Nares clear.  Pharynx not erythematous with PND noted.  Neck supple with mild soft anterior cervical adenopathy.  Lungs clear.  Heart regular rate and rhythm. ? ?Today's Vitals  ? 10/08/21 1439  ?BP: 138/79  ?Pulse: 71  ?Temp: 98.4 ?F (36.9 ?C)  ?SpO2: 98%  ?Weight: 192 lb (87.1 kg)  ?Height: '6\' 3"'$  (1.905 m)  ? ?Body mass index is 24 kg/m?. ?   ?Assessment & Plan:  ? ?Problem List Items Addressed This Visit   ? ?  ? Nervous and Auditory  ? Retracted tympanic membrane, bilateral - Primary  ? ?Recommend Nasacort AQ as directed.  Advised patient may take 7 to 10 days to see a significant difference.  Also recommend restarting Claritin or Allegra daily. ?Call back in 2 to 3 weeks if no improvement, sooner if worse. ?

## 2021-10-09 ENCOUNTER — Encounter: Payer: Self-pay | Admitting: Nurse Practitioner

## 2021-10-09 DIAGNOSIS — H73893 Other specified disorders of tympanic membrane, bilateral: Secondary | ICD-10-CM | POA: Insufficient documentation

## 2021-11-26 ENCOUNTER — Ambulatory Visit: Payer: 59 | Admitting: Family Medicine

## 2021-11-26 ENCOUNTER — Other Ambulatory Visit (HOSPITAL_COMMUNITY): Payer: Self-pay

## 2021-11-26 VITALS — BP 115/71 | HR 70 | Temp 98.4°F | Ht 75.0 in | Wt 194.0 lb

## 2021-11-26 DIAGNOSIS — K219 Gastro-esophageal reflux disease without esophagitis: Secondary | ICD-10-CM

## 2021-11-26 DIAGNOSIS — F439 Reaction to severe stress, unspecified: Secondary | ICD-10-CM | POA: Diagnosis not present

## 2021-11-26 MED ORDER — EPINEPHRINE 0.3 MG/0.3ML IJ SOAJ
0.3000 mg | INTRAMUSCULAR | 4 refills | Status: AC | PRN
  Filled 2021-11-26: qty 2, 2d supply, fill #0

## 2021-11-26 NOTE — Progress Notes (Signed)
? ?  Subjective:  ? ? Patient ID: Devin Palmer, male    DOB: March 26, 2001, 21 y.o.   MRN: [id number] ? ?HPI ? ?Patient did not pass out. He says he has really bad anxiety and thinks he  was having a panic attack. ?Patient with a lot of fatigue tiredness.  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 ? ?He describes a lot of burning and discomfort in the superior portion of his esophagus he states it just feels like a fullness there.  Denies any choking.  Denies regurgitation or vomiting ?Wants to discuss acid reflux. Stop the Protonix two weeks ago. ? ? ? ?Review of Systems ? ?   ?Objective:  ? Physical Exam ?Lungs are clear hearts regular pulse normal abdomen is soft ? ? ? ?   ?Assessment & Plan:  ?1. Gastroesophageal reflux disease without esophagitis ?Patient states pantoprazole seem to help but no longer helping.  Other medications states that they have red dye in them he states he is allergic to shellfish but does not think he is allergic to red dye he states 1 time he ate a large portion of velvet cake and had a rash with it but did not have hives ? ?We will touch base with pharmacy to see if Nexium or other medications have red dye with them.  May need to consider famotidine as an alternative. ? ?2. Stress ?Stress levels moderately elevated but overall has good focus of his future, does not feel he needs to be on any medication currently, ? ? ?

## 2021-12-02 ENCOUNTER — Other Ambulatory Visit (HOSPITAL_COMMUNITY): Payer: Self-pay

## 2021-12-08 ENCOUNTER — Other Ambulatory Visit: Payer: Self-pay | Admitting: *Deleted

## 2021-12-08 ENCOUNTER — Other Ambulatory Visit (HOSPITAL_COMMUNITY): Payer: Self-pay

## 2021-12-08 MED ORDER — LANSOPRAZOLE 30 MG PO CPDR
30.0000 mg | DELAYED_RELEASE_CAPSULE | Freq: Every day | ORAL | 1 refills | Status: AC
  Filled 2021-12-08: qty 90, 90d supply, fill #0

## 2021-12-08 NOTE — Progress Notes (Signed)
Prescription sent electronically to pharmacy. Telephone call-voicemail not set up to notify patient. (12/08/21)

## 2021-12-08 NOTE — Progress Notes (Signed)
Patient notified

## 2021-12-31 ENCOUNTER — Ambulatory Visit: Payer: 59 | Admitting: Allergy & Immunology

## 2021-12-31 ENCOUNTER — Encounter: Payer: Self-pay | Admitting: Allergy & Immunology

## 2021-12-31 VITALS — BP 128/72 | HR 59 | Temp 99.5°F | Resp 18 | Ht 74.0 in | Wt 179.4 lb

## 2021-12-31 DIAGNOSIS — J302 Other seasonal allergic rhinitis: Secondary | ICD-10-CM

## 2021-12-31 DIAGNOSIS — K219 Gastro-esophageal reflux disease without esophagitis: Secondary | ICD-10-CM

## 2021-12-31 DIAGNOSIS — J3089 Other allergic rhinitis: Secondary | ICD-10-CM

## 2021-12-31 DIAGNOSIS — T7800XD Anaphylactic reaction due to unspecified food, subsequent encounter: Secondary | ICD-10-CM

## 2021-12-31 NOTE — Progress Notes (Signed)
NEW PATIENT  Date of Service/Encounter:  12/31/21  Consult requested by: Kathyrn Drown, MD   Assessment:   Seasonal and perennial allergic rhinitis (grasses, weeds, trees, and indoor molds)  Anaphylactic shock due to food  GERD - follows with GI  Family history of stinging insect anaphylaxis  Plan/Recommendations:   1. Seasonal and perennial allergic rhinitis - Testing today showed: grasses, weeds, trees, and indoor molds. - Copy of test results provided.  - Avoidance measures provided. - Continue with: an over the counter antihistamine as needed - Consider taking: Astelin (azelastine) 2 sprays per nostril 1-2 times daily as needed (especially before doing mowing) - You can use an extra dose of the antihistamine, if needed, for breakthrough symptoms.  - Consider nasal saline rinses 1-2 times daily to remove allergens from the nasal cavities as well as help with mucous clearance (this is especially helpful to do before the nasal sprays are given) - Consider allergy shots as a means of long-term control. - Allergy shots "re-train" and "reset" the immune system to ignore environmental allergens and decrease the resulting immune response to those allergens (sneezing, itchy watery eyes, runny nose, nasal congestion, etc).    - Allergy shots improve symptoms in 75-85% of patients.  - I am not sure that you reactions are up to that severity, however.   2. Anaphylactic shock due to food - Testing was negative to seafood and nuts. - We are going to confirm with blood work. - We will call you in 1-2 weeks with the results of the testing. - We can come up with a plan after that.   3. Return in about 3 months (around 04/02/2022).    This note in its entirety was forwarded to the Provider who requested this consultation.  Subjective:   Devin Palmer is a 21 y.o. male presenting today for evaluation of  Chief Complaint  Patient presents with   Allergic Rhinitis      Seasonal allergies. Claritin in the morning and it helps. If not taken he'll get a stuffy nose.    Allergy Testing    Shellfish, nuts(diarrhea,cramps)  Bees-dad is allergic     Devin Palmer has a history of the following: Patient Active Problem List   Diagnosis Date Noted   Retracted tympanic membrane, bilateral 10/09/2021   IBS (irritable bowel syndrome) 09/01/2020   Shellfish allergy 08/19/2020   Gastroesophageal reflux disease without esophagitis 08/19/2020   Beta hemolytic Streptococcus culture positive 04/29/2020   Viral syndrome 04/23/2020   Fever 04/23/2020   Acne vulgaris 01/23/2018   Radius fracture 08/29/2011    History obtained from: chart review and patient.  [name] was referred by Kathyrn Drown, MD.     Devin Palmer is a 21 y.o. male presenting for an evaluation of multiple atopic complaints .   Allergic Rhinitis Symptom History: He does have some allergy symptoms during the spring mostly. He has issues when the tree pollen is bad. He does not get swollen eyes or headaches at all, but he sneezes a lot during the worst time of the year. Claritin will help. He is fine during the summer, fall, and winter.  Food Allergy Symptom History: He has digestive issues with shellfish and nuts.  He started having issues with diarrhea in 2019. He has incontrollable diarrhea and vomiting within 5-10 minutes of eating it. He used to eat all shellfish, but when he become around 21 years old or so, he developed issues with the GI  problems. It took him a long time to figure out that this a trigger. He never got hives from it aside from the extreme GI cramping. He falls asleep afterwards. Sometimes he does not vomiting, but he always get nauseous. He has never liked salmon, but he eats raw tuna and cod. He lives in [place], so he is around seafood very often. He had an episode a few months ago that caused a lot of anxiety for him. He had another episode with nuts when he was at a  restaurant that also served seafood. He was eating nuts which was an appetizer at a seafood restaurant. He is unsure whether there was some  cross contamination with the seafood. He used to eat peanut butter without a problem but he stopped after the nut episode. He does have an EpiPen around one month ago. He tolerates all of the major allergens aside from seafood and nuts.   GERD Symptom History: He was on pantoprazole. He sees Dr. Laural Golden here in [place].  He was on dicyclomine for a period of time, but this improved when he started eating better.   He has never been stung by a stinging insect, but his father is highly allergic.   Otherwise, there is no history of other atopic diseases, including drug allergies, stinging insect allergies, urticaria, or contact dermatitis. There is no significant infectious history. Vaccinations are up to date.    Past Medical History: Patient Active Problem List   Diagnosis Date Noted   Retracted tympanic membrane, bilateral 10/09/2021   IBS (irritable bowel syndrome) 09/01/2020   Shellfish allergy 08/19/2020   Gastroesophageal reflux disease without esophagitis 08/19/2020   Beta hemolytic Streptococcus culture positive 04/29/2020   Viral syndrome 04/23/2020   Fever 04/23/2020   Acne vulgaris 01/23/2018   Radius fracture 08/29/2011    Medication List:  Allergies as of 12/31/2021       Reactions   Red Dye Nausea Only   diarrhea   Shellfish Allergy    Diarrhea with abdominal cramps        Medication List        Accurate as of December 31, 2021  1:44 PM. If you have any questions, ask your nurse or doctor.          dicyclomine 20 MG tablet Commonly known as: BENTYL Take 0.5 tablets (10 mg total) by mouth 2 (two) times daily before a meal.   EPINEPHrine 0.3 mg/0.3 mL Soaj injection Commonly known as: EPI-PEN Inject 0.3 mg into the muscle as needed for anaphylaxis.   lansoprazole 30 MG capsule Commonly known as: Prevacid Take 1  capsule (30 mg total) by mouth daily.   loratadine 10 MG tablet Commonly known as: CLARITIN Take 10 mg by mouth daily.   PEPCID PO Take 1 tablet by mouth daily as needed.        Birth History: non-contributory  Developmental History: non-contributory  Past Surgical History: History reviewed. No pertinent surgical history.   Family History: Family History  Problem Relation Age of Onset   Heart disease Other    Arthritis Other    Cancer Other    Asthma Other    Diabetes Other      Social History: Dontee lives in [place] and is training to become an Agricultural consultant. He wants to be a free agent to be contracted out to certain episodes from different companies. 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 He wanted to take a different  direction at that time.  Currently, he is living in a house that is 22 years old.  There is wood in the main living areas and carpeting in the bedroom.  He has electric heating and central cooling.  There is 1 dog outside of his home.  There are dust mite covers on the bedding.  There is no tobacco exposure.  He does use a HEPA filter in his home.  There is no fume, chemical, or dust exposure.   Review of Systems  Constitutional: Negative.  Negative for chills, fever, malaise/fatigue and weight loss.  HENT: Negative.  Negative for congestion, ear discharge, ear pain and sinus pain.   Eyes:  Negative for pain, discharge and redness.  Respiratory:  Negative for cough, sputum production, shortness of breath, wheezing and stridor.   Cardiovascular: Negative.  Negative for chest pain and palpitations.  Gastrointestinal:  Positive for abdominal pain, diarrhea, nausea and vomiting. Negative for constipation and heartburn.  Skin: Negative.  Negative for itching and rash.  Neurological:  Negative for dizziness and headaches.  Endo/Heme/Allergies:  Negative for environmental allergies. Does not bruise/bleed easily.        Objective:   Blood pressure 128/72, pulse (!) 59, temperature 99.5 F (37.5 C), resp. rate 18, height '6\' 2"'$  (1.88 m), weight 179 lb 6 oz (81.4 kg), SpO2 97 %. Body mass index is 23.03 kg/m.     Physical Exam Vitals reviewed.  Constitutional:      Appearance: He is well-developed.  HENT:     Head: Normocephalic and atraumatic.     Right Ear: Tympanic membrane, ear canal and external ear normal. No drainage, swelling or tenderness. Tympanic membrane is not injected, scarred, erythematous, retracted or bulging.     Left Ear: Tympanic membrane, ear canal and external ear normal. No drainage, swelling or tenderness. Tympanic membrane is not injected, scarred, erythematous, retracted or bulging.     Nose: No nasal deformity, septal deviation, mucosal edema or rhinorrhea.     Right Turbinates: Enlarged, swollen and pale.     Left Turbinates: Enlarged, swollen and pale.     Right Sinus: No maxillary sinus tenderness or frontal sinus tenderness.     Left Sinus: No maxillary sinus tenderness or frontal sinus tenderness.     Mouth/Throat:     Lips: Pink.     Mouth: Mucous membranes are moist. Mucous membranes are not pale and not dry.     Pharynx: Uvula midline.     Comments: No nasal polyps noted.  Eyes:     General:        Right eye: No discharge.        Left eye: No discharge.     Conjunctiva/sclera: Conjunctivae normal.     Right eye: Right conjunctiva is not injected. No chemosis.    Left eye: Left conjunctiva is not injected. No chemosis.    Pupils: Pupils are equal, round, and reactive to light.  Cardiovascular:     Rate and Rhythm: Normal rate and regular rhythm.     Heart sounds: Normal heart sounds.  Pulmonary:     Effort: Pulmonary effort is normal. No tachypnea, accessory muscle usage or respiratory distress.     Breath sounds: Normal breath sounds. No wheezing, rhonchi or rales.  Chest:     Chest wall: No tenderness.  Abdominal:     Tenderness: There is no  abdominal tenderness. There is no guarding or rebound.  Lymphadenopathy:     Head:     Right  side of head: No submandibular, tonsillar or occipital adenopathy.     Left side of head: No submandibular, tonsillar or occipital adenopathy.     Cervical: No cervical adenopathy.  Skin:    Coloration: Skin is not pale.     Findings: No abrasion, erythema, petechiae or rash. Rash is not papular, urticarial or vesicular.  Neurological:     Mental Status: He is alert.  Psychiatric:        Behavior: Behavior is cooperative.      Diagnostic studies:   Allergy Studies:     Airborne Adult Perc - 12/31/21 1017     Time Antigen Placed 1017    Allergen Manufacturer Lavella Hammock    Location Back    Number of Test 59    Panel 1 Select    1. Control-Buffer 50% Glycerol Negative    2. Control-Histamine 1 mg/ml 2+    3. Albumin saline Negative    4. [place] 4+    5. [place] 4+    6. [place] 2+    7. [place] 4+    8. Meadow Fescue 4+    9. Perennial Rye 4+    10. Sweet Vernal 4+    11. Timothy 4+    12. Cocklebur 2+    13. Burweed Marshelder Negative    14. Ragweed, short Negative    15. Ragweed, Giant Negative    16. Plantain,  English 2+    17. Lamb's Quarters Negative    18. Sheep Sorrell 2+    19. Rough Pigweed 2+    20. Marsh Elder, Rough 2+    21. Mugwort, Common Negative    22. Ash mix Negative    23. Birch mix 2+    24. Beech American Negative    25. Box, Elder Negative    26. Cedar, red Negative    27. Cottonwood, [place] Negative    28. Elm mix Negative    29. Hickory 3+    30. Maple mix 2+    31. Oak, [place] mix Negative    32. Pecan Pollen 3+    33. Pine mix 2+    34. Sycamore Eastern Negative    35. [place], Black Pollen 3+    36. Alternaria alternata Negative    37. Cladosporium Herbarum Negative    38. Aspergillus mix Negative    39. Penicillium mix Negative    40. Bipolaris sorokiniana (Helminthosporium) Negative    41. Drechslera spicifera (Curvularia)  Negative    42. Mucor plumbeus Negative    43. Fusarium moniliforme Negative    44. Aureobasidium pullulans (pullulara) Negative    45. Rhizopus oryzae Negative    46. Botrytis cinera 2+    47. Epicoccum nigrum Negative    48. Phoma betae 2+    49. Candida Albicans Negative    50. Trichophyton mentagrophytes Negative    51. Mite, D Farinae  5,000 AU/ml Negative    52. Mite, D Pteronyssinus  5,000 AU/ml Negative    53. Cat Hair 10,000 BAU/ml Negative    54.  Dog Epithelia Negative    55. Mixed Feathers Negative    56. Horse Epithelia Negative    57. Cockroach, German Negative    58. Mouse Negative    59. Tobacco Leaf Negative             Food Adult Perc - 12/31/21 1000     Time Antigen Placed 1017    Allergen Manufacturer Lavella Hammock    Location Back  Number of allergen test 25     Control-buffer 50% Glycerol Negative    Control-Histamine 1 mg/ml 2+    1. Peanut Negative    8. Shellfish Mix Negative    9. Fish Mix Negative    10. Cashew Negative    11. Pecan Food Negative    12. [place] Negative    13. Almond Negative    14. Hazelnut Negative    15. [place] nut Negative    16. Coconut Negative    17. Pistachio Negative    18. Catfish Negative    19. Bass Negative    20. Trout Negative    21. Tuna Negative    22. Salmon Negative    23. Flounder Negative    24. Codfish Negative    25. Shrimp Negative    26. Crab Negative    27. Lobster Negative    28. Oyster Negative    29. Scallops Negative             Allergy testing results were read and interpreted by myself, documented by clinical staff.         Salvatore Marvel, MD [facility]

## 2021-12-31 NOTE — Patient Instructions (Addendum)
1. Seasonal and perennial allergic rhinitis - Testing today showed: grasses, weeds, trees, and indoor molds. - Copy of test results provided.  - Avoidance measures provided. - Continue with: an over the counter antihistamine as needed - Consider taking: Astelin (azelastine) 2 sprays per nostril 1-2 times daily as needed (especially before doing mowing) - You can use an extra dose of the antihistamine, if needed, for breakthrough symptoms.  - Consider nasal saline rinses 1-2 times daily to remove allergens from the nasal cavities as well as help with mucous clearance (this is especially helpful to do before the nasal sprays are given) - Consider allergy shots as a means of long-term control. - Allergy shots "re-train" and "reset" the immune system to ignore environmental allergens and decrease the resulting immune response to those allergens (sneezing, itchy watery eyes, runny nose, nasal congestion, etc).    - Allergy shots improve symptoms in 75-85% of patients.  - I am not sure that you reactions are up to that severity, however.   2. Anaphylactic shock due to food - Testing was negative to seafood and nuts. - We are going to confirm with blood work. - We will call you in 1-2 weeks with the results of the testing. - We can come up with a plan after that.   3. Return in about 3 months (around 04/02/2022).    Please inform us of any Emergency Department visits, hospitalizations, or changes in symptoms. Call us before going to the ED for breathing or allergy symptoms since we might be able to fit you in for a sick visit. Feel free to contact us anytime with any questions, problems, or concerns.  It was a pleasure to meet you today!  Websites that have reliable patient information: 1. American Academy of Asthma, Allergy, and Immunology: www.aaaai.org 2. Food Allergy Research and Education (FARE): foodallergy.org 3. Mothers of Asthmatics: http://www.asthmacommunitynetwork.org 4. American  College of Allergy, Asthma, and Immunology: www.acaai.org   COVID-19 Vaccine Information can be found at: [email or web address].co.uk For questions related to vaccine distribution or appointments, please email [email or web address] or call [phone].   We realize that you might be concerned about having an allergic reaction to the COVID19 vaccines. To help with that concern, WE ARE OFFERING THE COVID19 VACCINES IN OUR OFFICE! Ask the front desk for dates!     "Like" [place] on Facebook and Instagram for our latest updates!      A healthy democracy works best when [employer] participate! Make sure you are registered to vote! If you have moved or changed any of your contact information, you will need to get this updated before voting!  In some cases, you MAY be able to register to vote online: [email or web address]     Airborne Adult Perc - 12/31/21 1017     Time Antigen Placed [place] Lavella Hammock    Location Back    Number of Test 59    Panel 1 Select    1. Control-Buffer 50% Glycerol Negative    2. Control-Histamine 1 mg/ml 2+    3. Albumin saline Negative    4. [place] 4+    5. [place] 4+    6. [place] 2+    7. [place] 4+    8. Meadow Fescue 4+    9. Perennial Rye 4+    10. Sweet Vernal 4+    11. Timothy 4+    12. Cocklebur 2+    13. Burweed Marshelder Negative    14. Ragweed, short  Negative    15. Ragweed, Giant Negative    16. Plantain,  English 2+    17. Lamb's Quarters Negative    18. Sheep Sorrell 2+    19. Rough Pigweed 2+    20. Marsh Elder, Rough 2+    21. Mugwort, Common Negative    22. Ash mix Negative    23. Birch mix 2+    24. Beech American Negative    25. Box, Elder Negative    26. Cedar, red Negative    27. Cottonwood, [place] Negative    28. Elm mix Negative    29. Hickory 3+    30. Maple mix 2+    31. Oak, [place] mix Negative    32. Pecan Pollen  3+    33. Pine mix 2+    34. Sycamore Eastern Negative    35. [place], Black Pollen 3+    36. Alternaria alternata Negative    37. Cladosporium Herbarum Negative    38. Aspergillus mix Negative    39. Penicillium mix Negative    40. Bipolaris sorokiniana (Helminthosporium) Negative    41. Drechslera spicifera (Curvularia) Negative    42. Mucor plumbeus Negative    43. Fusarium moniliforme Negative    44. Aureobasidium pullulans (pullulara) Negative    45. Rhizopus oryzae Negative    46. Botrytis cinera 2+    47. Epicoccum nigrum Negative    48. Phoma betae 2+    49. Candida Albicans Negative    50. Trichophyton mentagrophytes Negative    51. Mite, D Farinae  5,000 AU/ml Negative    52. Mite, D Pteronyssinus  5,000 AU/ml Negative    53. Cat Hair 10,000 BAU/ml Negative    54.  Dog Epithelia Negative    55. Mixed Feathers Negative    56. Horse Epithelia Negative    57. Cockroach, German Negative    58. Mouse Negative    59. Tobacco Leaf Negative             Food Adult Perc - 12/31/21 1000     Time Antigen Placed 1017    Allergen Manufacturer Lavella Hammock    Location Back    Number of allergen test 25     Control-buffer 50% Glycerol Negative    Control-Histamine 1 mg/ml 2+    1. Peanut Negative    8. Shellfish Mix Negative    9. Fish Mix Negative    10. Cashew Negative    11. Pecan Food Negative    12. [place] Negative    13. Almond Negative    14. Hazelnut Negative    15. [place] nut Negative    16. Coconut Negative    17. Pistachio Negative    18. Catfish Negative    19. Bass Negative    20. Trout Negative    21. Tuna Negative    22. Salmon Negative    23. Flounder Negative    24. Codfish Negative    25. Shrimp Negative    26. Crab Negative    27. Lobster Negative    28. Oyster Negative    29. Scallops Negative            Reducing Pollen Exposure  The American Academy of Allergy, Asthma and Immunology suggests the following steps to reduce your  exposure to pollen during allergy seasons.    Do not hang sheets or clothing out to dry; pollen may collect on these items. Do not mow lawns or spend time around freshly  cut grass; mowing stirs up pollen. Keep windows closed at night.  Keep car windows closed while driving. Minimize morning activities outdoors, a time when pollen counts are usually at their highest. Stay indoors as much as possible when pollen counts or humidity is high and on windy days when pollen tends to remain in the air longer. Use air conditioning when possible.  Many air conditioners have filters that trap the pollen spores. Use a HEPA room air filter to remove pollen form the indoor air you breathe.  Control of Mold Allergen   Mold and fungi can grow on a variety of surfaces provided certain temperature and moisture conditions exist.  Outdoor molds grow on plants, decaying vegetation and soil.  The major outdoor mold, Alternaria and Cladosporium, are found in very high numbers during hot and dry conditions.  Generally, a late Summer - Fall peak is seen for common outdoor fungal spores.  Rain will temporarily lower outdoor mold spore count, but counts rise rapidly when the rainy period ends.  The most important indoor molds are Aspergillus and Penicillium.  Dark, humid and poorly ventilated basements are ideal sites for mold growth.  The next most common sites of mold growth are the bathroom and the kitchen.   Indoor (Perennial) Mold Control   Positive indoor molds via skin testing: Botrytis and Phoma  Maintain humidity below 50%. Clean washable surfaces with 5% bleach solution. Remove sources e.g. contaminated carpets.

## 2022-04-01 ENCOUNTER — Ambulatory Visit: Payer: 59 | Admitting: Allergy & Immunology

## 2022-04-01 DIAGNOSIS — J309 Allergic rhinitis, unspecified: Secondary | ICD-10-CM

## 2022-05-10 ENCOUNTER — Ambulatory Visit: Payer: 59 | Admitting: Nurse Practitioner

## 2022-05-10 ENCOUNTER — Encounter: Payer: Self-pay | Admitting: Nurse Practitioner

## 2022-05-10 VITALS — BP 118/72 | Ht 75.0 in | Wt 183.4 lb

## 2022-05-10 DIAGNOSIS — N489 Disorder of penis, unspecified: Secondary | ICD-10-CM

## 2022-05-10 NOTE — Progress Notes (Signed)
   Subjective:    Patient ID: Devin Palmer, male    DOB: 04-09-2001, 21 y.o.   MRN: [id number]  HPI  21 year old male patient presents today with concerns of bumps to his private area.  Patient states that areas resemble bites on upper thighs and groin area since 1 month ago- not irritated or itchy.  Patient states that he 1 large bump to his penile shaft that he initially noted approximately 3 months ago.  Patient states he initially popped it and it got larger.  Patient denies any pain to penis, dysuria, discharge to penis, fever, chills, scrotal pain, swelling, discharge from bumps.  Review of Systems  Skin:        Bumps  All other systems reviewed and are negative.      Objective:   Physical Exam Vitals reviewed. Exam conducted with a chaperone present.  Constitutional:      General: He is not in acute distress.    Appearance: Normal appearance. He is normal weight. He is not ill-appearing, toxic-appearing or diaphoretic.  HENT:     Head: Normocephalic and atraumatic.  Genitourinary:    Penis: Circumcised. Lesions present. No phimosis, paraphimosis, hypospadias, erythema, tenderness, discharge or swelling.      Comments: Multiple reddened punctuated lesions noted to pubic area and penile base.  1 large lesion noted to left side of penile shaft that near head of penis.  No swelling, redness, or discharge noted.  Area is nonpainful to palpation.  Nonpruritic.  No discharge from head of penis noted on exam. Musculoskeletal:     Cervical back: No tenderness.     Comments: Grossly intact  Neurological:     Mental Status: He is alert.     Comments: Grossly intact  Psychiatric:        Mood and Affect: Mood normal.        Behavior: Behavior normal.           Assessment & Plan:   1. Penile lesion - Molluscum vs Genital Warts - Ambulatory referral to Dermatology for evaluation and treatment - RTC as needed.

## 2022-05-17 ENCOUNTER — Telehealth: Payer: Self-pay | Admitting: Family Medicine

## 2022-05-17 DIAGNOSIS — N489 Disorder of penis, unspecified: Secondary | ICD-10-CM

## 2022-05-17 NOTE — Telephone Encounter (Signed)
Referral placed to [facility] and pt is aware

## 2022-05-17 NOTE — Telephone Encounter (Signed)
Patient is requesting to be referred to different office because he states it will be 7 months out and he called [facility] and they can see him on 11/16 if a referral is sent over. He wants to see Dr. Mickel Fuchs

## 2022-05-29 ENCOUNTER — Encounter (INDEPENDENT_AMBULATORY_CARE_PROVIDER_SITE_OTHER): Payer: Self-pay | Admitting: Gastroenterology

## 2022-06-16 DIAGNOSIS — L989 Disorder of the skin and subcutaneous tissue, unspecified: Secondary | ICD-10-CM | POA: Diagnosis not present

## 2022-06-20 DIAGNOSIS — B081 Molluscum contagiosum: Secondary | ICD-10-CM | POA: Diagnosis not present

## 2022-07-12 ENCOUNTER — Other Ambulatory Visit (HOSPITAL_COMMUNITY): Payer: Self-pay

## 2022-08-11 ENCOUNTER — Other Ambulatory Visit (HOSPITAL_COMMUNITY): Payer: Self-pay

## 2022-09-15 ENCOUNTER — Other Ambulatory Visit: Payer: Self-pay

## 2022-11-07 DIAGNOSIS — B081 Molluscum contagiosum: Secondary | ICD-10-CM | POA: Diagnosis not present

## 2022-11-07 DIAGNOSIS — S70369A Insect bite (nonvenomous), unspecified thigh, initial encounter: Secondary | ICD-10-CM | POA: Diagnosis not present

## 2022-11-07 DIAGNOSIS — L739 Follicular disorder, unspecified: Secondary | ICD-10-CM | POA: Diagnosis not present

## 2022-11-07 DIAGNOSIS — W57XXXA Bitten or stung by nonvenomous insect and other nonvenomous arthropods, initial encounter: Secondary | ICD-10-CM | POA: Diagnosis not present

## 2023-02-28 DIAGNOSIS — D225 Melanocytic nevi of trunk: Secondary | ICD-10-CM | POA: Diagnosis not present

## 2023-02-28 DIAGNOSIS — Z1283 Encounter for screening for malignant neoplasm of skin: Secondary | ICD-10-CM | POA: Diagnosis not present

## 2023-08-13 DIAGNOSIS — R059 Cough, unspecified: Secondary | ICD-10-CM | POA: Diagnosis not present

## 2023-08-13 DIAGNOSIS — J029 Acute pharyngitis, unspecified: Secondary | ICD-10-CM | POA: Diagnosis not present

## 2023-08-13 DIAGNOSIS — J309 Allergic rhinitis, unspecified: Secondary | ICD-10-CM | POA: Diagnosis not present

## 2023-08-18 ENCOUNTER — Other Ambulatory Visit (HOSPITAL_COMMUNITY): Payer: Self-pay

## 2024-04-10 DIAGNOSIS — M6282 Rhabdomyolysis: Secondary | ICD-10-CM | POA: Diagnosis not present

## 2024-04-10 DIAGNOSIS — N179 Acute kidney failure, unspecified: Secondary | ICD-10-CM | POA: Diagnosis not present

## 2024-04-10 DIAGNOSIS — E860 Dehydration: Secondary | ICD-10-CM | POA: Diagnosis not present

## 2024-05-01 ENCOUNTER — Encounter (INDEPENDENT_AMBULATORY_CARE_PROVIDER_SITE_OTHER): Payer: Self-pay | Admitting: Gastroenterology
